# Patient Record
Sex: Male | Born: 2000 | Race: White | Hispanic: No | Marital: Married | Smoking: Never smoker
Health system: Southern US, Community
[De-identification: ages and names within clinical notes are randomized; demographics above are authoritative.]

## PROBLEM LIST (undated history)

## (undated) DIAGNOSIS — S42301A Unspecified fracture of shaft of humerus, right arm, initial encounter for closed fracture: Secondary | ICD-10-CM

## (undated) DIAGNOSIS — Z789 Other specified health status: Secondary | ICD-10-CM

## (undated) DIAGNOSIS — I619 Nontraumatic intracerebral hemorrhage, unspecified: Secondary | ICD-10-CM

---
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
## 2021-06-24 ENCOUNTER — Ambulatory Visit: Payer: Self-pay | Admitting: General Surgery

## 2021-06-24 NOTE — H&P (Signed)
Chief Complaint: Hernia (consultation)       History of Present Illness: Jonathan Finley is a 20 y.o. male who is seen today as an office consultation at the request of PA Anna Genre for evaluation of Hernia (consultation) .     Patient is a 20 year old male, comes in with no past medical history.  Patient's had a hernia for the last 2 to 3 months.  He states that he notices it with lifting.  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  He states that he has more pain at the end of the day.  Patient states that he does notice that the hernia gets larger.  Does have some discomfort with lifting. Patient had no previous abdominal surgery. Has had no signs or symptoms of incarceration or strangulation.     Review of Systems: A complete review of systems was obtained from the patient.  I have reviewed this information and discussed as appropriate with the patient.  See HPI as well for other ROS.   Review of Systems  Constitutional: Negative.   HENT: Negative.   Eyes: Negative.   Respiratory: Negative.   Cardiovascular: Negative.   Gastrointestinal: Negative.   Genitourinary: Negative.   Musculoskeletal: Negative.   Skin: Negative.   Neurological: Negative.   Endo/Heme/Allergies: Negative.   Psychiatric/Behavioral: Negative.         Medical History: Past Medical History  History reviewed. No pertinent past medical history.     There is no problem list on file for this patient.     Past Surgical History       Past Surgical History:  Procedure Laterality Date   shoulder surgery N/A          Allergies  No Known Allergies     No current outpatient medications on file prior to visit.    No current facility-administered medications on file prior to visit.      Family History       Family History  Problem Relation Age of Onset   High blood pressure (Hypertension) Mother          Social History       Tobacco Use  Smoking Status Never Smoker   Smokeless Tobacco Never Used      Social History  Social History        Socioeconomic History   Marital status: Unknown  Tobacco Use   Smoking status: Never Smoker   Smokeless tobacco: Never Used  Building services engineer Use: Never used  Substance and Sexual Activity   Alcohol use: Never   Drug use: Never   Sexual activity: Never        Objective:         Vitals:    05/30/21 0904  BP: 120/80  Pulse: 71  Temp: 36.9 C (98.4 F)  SpO2: 100%  Weight: 89.2 kg (196 lb 9.6 oz)  Height: 185.4 cm (6\' 1" )    Body mass index is 25.94 kg/m.   Physical Exam Constitutional:      Appearance: Normal appearance.  HENT:     Head: Normocephalic and atraumatic.     Nose: Nose normal.     Mouth/Throat:     Mouth: Mucous membranes are moist.     Pharynx: Oropharynx is clear.  Eyes:     General: No scleral icterus.    Extraocular Movements: Extraocular movements intact.     Pupils: Pupils are equal, round, and reactive to light.  Cardiovascular:     Rate and Rhythm: Normal rate and regular rhythm.     Pulses: Normal pulses.     Heart sounds: Normal heart sounds. No murmur heard.   No gallop.  Pulmonary:     Effort: Pulmonary effort is normal. No respiratory distress.     Breath sounds: No wheezing, rhonchi or rales.  Abdominal:     General: Abdomen is flat.     Palpations: Abdomen is soft.     Hernia: A hernia is present. Hernia is present in the right inguinal area.  Musculoskeletal:        General: Normal range of motion.     Cervical back: Normal range of motion.     Right lower leg: No edema.     Left lower leg: No edema.  Skin:    General: Skin is warm and dry.  Neurological:     General: No focal deficit present.     Mental Status: He is alert.  Psychiatric:        Mood and Affect: Mood normal.        Thought Content: Thought content normal.        Judgment: Judgment normal.                Assessment and Plan:  Diagnoses and all orders for this  visit:   Non-recurrent unilateral inguinal hernia without obstruction or gangrene     Jonathan Finley is a 20 y.o. male  Patient is a 20 year old male with a right inguinal hernia.    We will proceed to the OR for a laparoscopic right inguinal hernia repair with mesh. All risks and benefits were discussed with the patient, to generally include infection, bleeding, damage to surrounding structures, acute and chronic nerve pain, and recurrence. Alternatives were offered and described.  All questions were answered and the patient voiced understanding of the procedure and wishes to proceed at this point.

## 2021-06-24 NOTE — H&P (View-Only) (Signed)
Chief Complaint: Hernia (consultation)       History of Present Illness: Jonathan Finley is a 20 y.o. male who is seen today as an office consultation at the request of PA Conroy for evaluation of Hernia (consultation) .     Patient is a 20-year-old male, comes in with no past medical history.  Patient's had a hernia for the last 2 to 3 months.  He states that he notices it with lifting.  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  He states that he has more pain at the end of the day.  Patient states that he does notice that the hernia gets larger.  Does have some discomfort with lifting. Patient had no previous abdominal surgery. Has had no signs or symptoms of incarceration or strangulation.     Review of Systems: A complete review of systems was obtained from the patient.  I have reviewed this information and discussed as appropriate with the patient.  See HPI as well for other ROS.   Review of Systems  Constitutional: Negative.   HENT: Negative.   Eyes: Negative.   Respiratory: Negative.   Cardiovascular: Negative.   Gastrointestinal: Negative.   Genitourinary: Negative.   Musculoskeletal: Negative.   Skin: Negative.   Neurological: Negative.   Endo/Heme/Allergies: Negative.   Psychiatric/Behavioral: Negative.         Medical History: Past Medical History  History reviewed. No pertinent past medical history.     There is no problem list on file for this patient.     Past Surgical History       Past Surgical History:  Procedure Laterality Date   shoulder surgery N/A          Allergies  No Known Allergies     No current outpatient medications on file prior to visit.    No current facility-administered medications on file prior to visit.      Family History       Family History  Problem Relation Age of Onset   High blood pressure (Hypertension) Mother          Social History       Tobacco Use  Smoking Status Never Smoker   Smokeless Tobacco Never Used      Social History  Social History        Socioeconomic History   Marital status: Unknown  Tobacco Use   Smoking status: Never Smoker   Smokeless tobacco: Never Used  Vaping Use   Vaping Use: Never used  Substance and Sexual Activity   Alcohol use: Never   Drug use: Never   Sexual activity: Never        Objective:         Vitals:    05/30/21 0904  BP: 120/80  Pulse: 71  Temp: 36.9 C (98.4 F)  SpO2: 100%  Weight: 89.2 kg (196 lb 9.6 oz)  Height: 185.4 cm (6' 1")    Body mass index is 25.94 kg/m.   Physical Exam Constitutional:      Appearance: Normal appearance.  HENT:     Head: Normocephalic and atraumatic.     Nose: Nose normal.     Mouth/Throat:     Mouth: Mucous membranes are moist.     Pharynx: Oropharynx is clear.  Eyes:     General: No scleral icterus.    Extraocular Movements: Extraocular movements intact.     Pupils: Pupils are equal, round, and reactive to light.    Cardiovascular:     Rate and Rhythm: Normal rate and regular rhythm.     Pulses: Normal pulses.     Heart sounds: Normal heart sounds. No murmur heard.   No gallop.  Pulmonary:     Effort: Pulmonary effort is normal. No respiratory distress.     Breath sounds: No wheezing, rhonchi or rales.  Abdominal:     General: Abdomen is flat.     Palpations: Abdomen is soft.     Hernia: A hernia is present. Hernia is present in the right inguinal area.  Musculoskeletal:        General: Normal range of motion.     Cervical back: Normal range of motion.     Right lower leg: No edema.     Left lower leg: No edema.  Skin:    General: Skin is warm and dry.  Neurological:     General: No focal deficit present.     Mental Status: He is alert.  Psychiatric:        Mood and Affect: Mood normal.        Thought Content: Thought content normal.        Judgment: Judgment normal.                Assessment and Plan:  Diagnoses and all orders for this  visit:   Non-recurrent unilateral inguinal hernia without obstruction or gangrene     Jonathan Finley is a 20 y.o. male  Patient is a 20 year old male with a right inguinal hernia.    We will proceed to the OR for a laparoscopic right inguinal hernia repair with mesh. All risks and benefits were discussed with the patient, to generally include infection, bleeding, damage to surrounding structures, acute and chronic nerve pain, and recurrence. Alternatives were offered and described.  All questions were answered and the patient voiced understanding of the procedure and wishes to proceed at this point.

## 2021-06-25 ENCOUNTER — Encounter (HOSPITAL_COMMUNITY): Payer: Self-pay | Admitting: General Surgery

## 2021-06-25 ENCOUNTER — Other Ambulatory Visit: Payer: Self-pay

## 2021-06-25 NOTE — Progress Notes (Signed)
Spoke with pt for pre-op call. Pt denies cardiac history or Diabetes.  Pt's surgery is scheduled as ambulatory so no Covid test is required prior to surgery. Pt denies any Covid symptoms.

## 2021-06-26 ENCOUNTER — Ambulatory Visit (HOSPITAL_COMMUNITY): Payer: Medicaid Other

## 2021-06-26 ENCOUNTER — Encounter (HOSPITAL_COMMUNITY)
Admission: RE | Disposition: A | Discharge status: Home / Self Care | Payer: Self-pay | Source: Home / Self Care | Attending: General Surgery

## 2021-06-26 ENCOUNTER — Ambulatory Visit (HOSPITAL_COMMUNITY)
Admission: RE | Admit: 2021-06-26 | Discharge: 2021-06-26 | Disposition: A | Discharge status: Home / Self Care | Payer: Medicaid Other | Attending: General Surgery | Admitting: General Surgery

## 2021-06-26 DIAGNOSIS — K4090 Unilateral inguinal hernia, without obstruction or gangrene, not specified as recurrent: Secondary | ICD-10-CM | POA: Insufficient documentation

## 2021-06-26 HISTORY — PX: INSERTION OF MESH: SHX5868

## 2021-06-26 HISTORY — DX: Other specified health status: Z78.9

## 2021-06-26 HISTORY — PX: INGUINAL HERNIA REPAIR: SHX194

## 2021-06-26 LAB — CBC
HCT: 44.8 % (ref 39.0–52.0)
Hemoglobin: 16 g/dL (ref 13.0–17.0)
MCH: 31.6 pg (ref 26.0–34.0)
MCHC: 35.7 g/dL (ref 30.0–36.0)
MCV: 88.4 fL (ref 80.0–100.0)
Platelets: 191 10*3/uL (ref 150–400)
RBC: 5.07 MIL/uL (ref 4.22–5.81)
RDW: 11.8 % (ref 11.5–15.5)
WBC: 3.6 10*3/uL — ABNORMAL LOW (ref 4.0–10.5)
nRBC: 0 % (ref 0.0–0.2)

## 2021-06-26 SURGERY — REPAIR, HERNIA, INGUINAL, LAPAROSCOPIC
Anesthesia: General | Site: Inguinal | Laterality: Right

## 2021-06-26 MED ORDER — DEXAMETHASONE SODIUM PHOSPHATE 10 MG/ML IJ SOLN
INTRAMUSCULAR | Status: DC | PRN
  Administered 2021-06-26: 10 mg via INTRAVENOUS

## 2021-06-26 MED ORDER — FENTANYL CITRATE (PF) 100 MCG/2ML IJ SOLN
25.0000 ug | INTRAMUSCULAR | Status: DC | PRN
  Administered 2021-06-26 (×3): 25 ug via INTRAVENOUS

## 2021-06-26 MED ORDER — CHLORHEXIDINE GLUCONATE 0.12 % MT SOLN: 15.0000 mL | Freq: Once | OROMUCOSAL | Status: AC

## 2021-06-26 MED ORDER — LACTATED RINGERS IV SOLN: INTRAVENOUS | Status: DC

## 2021-06-26 MED ORDER — PROTAMINE SULFATE 10 MG/ML IV SOLN
INTRAVENOUS | Status: AC
  Filled 2021-06-26: qty 5

## 2021-06-26 MED ORDER — ACETAMINOPHEN 500 MG PO TABS: 1000.0000 mg | ORAL_TABLET | ORAL | Status: AC

## 2021-06-26 MED ORDER — MIDAZOLAM HCL 2 MG/2ML IJ SOLN
INTRAMUSCULAR | Status: AC
  Filled 2021-06-26: qty 2

## 2021-06-26 MED ORDER — OXYCODONE HCL 5 MG/5ML PO SOLN: 5.0000 mg | Freq: Once | ORAL | Status: AC | PRN

## 2021-06-26 MED ORDER — TRAMADOL HCL 50 MG PO TABS: 50.0000 mg | ORAL_TABLET | Freq: Four times a day (QID) | ORAL | 0 refills | Status: DC | PRN

## 2021-06-26 MED ORDER — OXYCODONE HCL 5 MG PO TABS
5.0000 mg | ORAL_TABLET | Freq: Once | ORAL | Status: AC | PRN
  Administered 2021-06-26: 5 mg via ORAL

## 2021-06-26 MED ORDER — OXYCODONE HCL 5 MG PO TABS
ORAL_TABLET | ORAL | Status: AC
  Filled 2021-06-26: qty 1

## 2021-06-26 MED ORDER — CHLORHEXIDINE GLUCONATE CLOTH 2 % EX PADS: 6.0000 | MEDICATED_PAD | Freq: Once | CUTANEOUS | Status: DC

## 2021-06-26 MED ORDER — LIDOCAINE 2% (20 MG/ML) 5 ML SYRINGE
INTRAMUSCULAR | Status: DC | PRN
  Administered 2021-06-26: 60 mg via INTRAVENOUS

## 2021-06-26 MED ORDER — ACETAMINOPHEN 500 MG PO TABS
ORAL_TABLET | ORAL | Status: AC
  Administered 2021-06-26: 1000 mg via ORAL
  Filled 2021-06-26: qty 2

## 2021-06-26 MED ORDER — MIDAZOLAM HCL 2 MG/2ML IJ SOLN
INTRAMUSCULAR | Status: DC | PRN
  Administered 2021-06-26: 2 mg via INTRAVENOUS

## 2021-06-26 MED ORDER — 0.9 % SODIUM CHLORIDE (POUR BTL) OPTIME
TOPICAL | Status: DC | PRN
  Administered 2021-06-26: 1000 mL

## 2021-06-26 MED ORDER — VANCOMYCIN HCL IN DEXTROSE 1-5 GM/200ML-% IV SOLN
INTRAVENOUS | Status: AC
  Administered 2021-06-26: 1000 mg via INTRAVENOUS
  Filled 2021-06-26: qty 200

## 2021-06-26 MED ORDER — PROPOFOL 10 MG/ML IV BOLUS
INTRAVENOUS | Status: DC | PRN
  Administered 2021-06-26: 200 mg via INTRAVENOUS

## 2021-06-26 MED ORDER — ROCURONIUM BROMIDE 10 MG/ML (PF) SYRINGE
PREFILLED_SYRINGE | INTRAVENOUS | Status: DC | PRN
  Administered 2021-06-26: 60 mg via INTRAVENOUS

## 2021-06-26 MED ORDER — FENTANYL CITRATE (PF) 250 MCG/5ML IJ SOLN
INTRAMUSCULAR | Status: AC
  Filled 2021-06-26: qty 5

## 2021-06-26 MED ORDER — SUGAMMADEX SODIUM 200 MG/2ML IV SOLN
INTRAVENOUS | Status: DC | PRN
  Administered 2021-06-26: 200 mg via INTRAVENOUS

## 2021-06-26 MED ORDER — PROPOFOL 10 MG/ML IV BOLUS
INTRAVENOUS | Status: AC
  Filled 2021-06-26: qty 20

## 2021-06-26 MED ORDER — ORAL CARE MOUTH RINSE: 15.0000 mL | Freq: Once | OROMUCOSAL | Status: AC

## 2021-06-26 MED ORDER — CEFAZOLIN SODIUM-DEXTROSE 2-4 GM/100ML-% IV SOLN: 2.0000 g | INTRAVENOUS | Status: DC

## 2021-06-26 MED ORDER — ENSURE PRE-SURGERY PO LIQD: 296.0000 mL | Freq: Once | ORAL | Status: DC

## 2021-06-26 MED ORDER — DEXAMETHASONE SODIUM PHOSPHATE 10 MG/ML IJ SOLN
INTRAMUSCULAR | Status: AC
  Filled 2021-06-26: qty 1

## 2021-06-26 MED ORDER — VANCOMYCIN HCL IN DEXTROSE 1-5 GM/200ML-% IV SOLN: 1000.0000 mg | INTRAVENOUS | Status: AC

## 2021-06-26 MED ORDER — BUPIVACAINE HCL 0.25 % IJ SOLN
INTRAMUSCULAR | Status: DC | PRN
  Administered 2021-06-26: 5 mL

## 2021-06-26 MED ORDER — ONDANSETRON HCL 4 MG/2ML IJ SOLN: 4.0000 mg | Freq: Four times a day (QID) | INTRAMUSCULAR | Status: DC | PRN

## 2021-06-26 MED ORDER — ONDANSETRON HCL 4 MG/2ML IJ SOLN
INTRAMUSCULAR | Status: AC
  Filled 2021-06-26: qty 2

## 2021-06-26 MED ORDER — FENTANYL CITRATE (PF) 250 MCG/5ML IJ SOLN
INTRAMUSCULAR | Status: DC | PRN
  Administered 2021-06-26: 100 ug via INTRAVENOUS
  Administered 2021-06-26 (×2): 50 ug via INTRAVENOUS

## 2021-06-26 MED ORDER — CHLORHEXIDINE GLUCONATE 0.12 % MT SOLN
OROMUCOSAL | Status: AC
  Administered 2021-06-26: 15 mL via OROMUCOSAL
  Filled 2021-06-26: qty 15

## 2021-06-26 MED ORDER — BUPIVACAINE HCL (PF) 0.25 % IJ SOLN
INTRAMUSCULAR | Status: AC
  Filled 2021-06-26: qty 30

## 2021-06-26 MED ORDER — LIDOCAINE 2% (20 MG/ML) 5 ML SYRINGE
INTRAMUSCULAR | Status: AC
  Filled 2021-06-26: qty 5

## 2021-06-26 MED ORDER — ONDANSETRON HCL 4 MG/2ML IJ SOLN
INTRAMUSCULAR | Status: DC | PRN
  Administered 2021-06-26: 4 mg via INTRAVENOUS

## 2021-06-26 MED ORDER — ROCURONIUM BROMIDE 10 MG/ML (PF) SYRINGE
PREFILLED_SYRINGE | INTRAVENOUS | Status: AC
  Filled 2021-06-26: qty 10

## 2021-06-26 MED ORDER — DEXMEDETOMIDINE (PRECEDEX) IN NS 20 MCG/5ML (4 MCG/ML) IV SYRINGE
PREFILLED_SYRINGE | INTRAVENOUS | Status: DC | PRN
  Administered 2021-06-26 (×2): 4 ug via INTRAVENOUS
  Administered 2021-06-26: 8 ug via INTRAVENOUS
  Administered 2021-06-26: 4 ug via INTRAVENOUS

## 2021-06-26 MED ORDER — DIPHENHYDRAMINE HCL 50 MG/ML IJ SOLN
INTRAMUSCULAR | Status: DC | PRN
  Administered 2021-06-26: 25 mg via INTRAVENOUS

## 2021-06-26 MED ORDER — FENTANYL CITRATE (PF) 100 MCG/2ML IJ SOLN
INTRAMUSCULAR | Status: AC
  Filled 2021-06-26: qty 2

## 2021-06-26 SURGICAL SUPPLY — 38 items
ADH SKN CLS APL DERMABOND .7 (GAUZE/BANDAGES/DRESSINGS) ×1
COVER SURGICAL LIGHT HANDLE (MISCELLANEOUS) ×2 IMPLANT
DERMABOND ADVANCED (GAUZE/BANDAGES/DRESSINGS) ×1
DERMABOND ADVANCED .7 DNX12 (GAUZE/BANDAGES/DRESSINGS) ×1 IMPLANT
DISSECTOR BLUNT TIP ENDO 5MM (MISCELLANEOUS) IMPLANT
ELECT REM PT RETURN 9FT ADLT (ELECTROSURGICAL) ×2
ELECTRODE REM PT RTRN 9FT ADLT (ELECTROSURGICAL) ×1 IMPLANT
ENDOLOOP SUT PDS II  0 18 (SUTURE)
ENDOLOOP SUT PDS II 0 18 (SUTURE) IMPLANT
GLOVE SURG ENC MOIS LTX SZ7.5 (GLOVE) ×2 IMPLANT
GOWN STRL REUS W/ TWL LRG LVL3 (GOWN DISPOSABLE) ×2 IMPLANT
GOWN STRL REUS W/ TWL XL LVL3 (GOWN DISPOSABLE) ×1 IMPLANT
GOWN STRL REUS W/TWL LRG LVL3 (GOWN DISPOSABLE) ×4
GOWN STRL REUS W/TWL XL LVL3 (GOWN DISPOSABLE) ×2
KIT BASIN OR (CUSTOM PROCEDURE TRAY) ×2 IMPLANT
KIT TURNOVER KIT B (KITS) ×2 IMPLANT
MESH 3DMAX 5X7 RT XLRG (Mesh General) ×1 IMPLANT
NDL INSUFFLATION 14GA 120MM (NEEDLE) IMPLANT
NEEDLE INSUFFLATION 14GA 120MM (NEEDLE) ×2 IMPLANT
NS IRRIG 1000ML POUR BTL (IV SOLUTION) ×2 IMPLANT
PAD ARMBOARD 7.5X6 YLW CONV (MISCELLANEOUS) ×4 IMPLANT
RELOAD STAPLE 4.0 BLU F/HERNIA (INSTRUMENTS) IMPLANT
RELOAD STAPLE 4.8 BLK F/HERNIA (STAPLE) IMPLANT
RELOAD STAPLE HERNIA 4.0 BLUE (INSTRUMENTS) ×2 IMPLANT
RELOAD STAPLE HERNIA 4.8 BLK (STAPLE) IMPLANT
SCISSORS LAP 5X35 DISP (ENDOMECHANICALS) ×1 IMPLANT
SET TUBE SMOKE EVAC HIGH FLOW (TUBING) ×2 IMPLANT
STAPLER HERNIA 12 8.5 360D (INSTRUMENTS) ×1 IMPLANT
SUT MNCRL AB 4-0 PS2 18 (SUTURE) ×2 IMPLANT
SYRINGE TOOMEY DISP (SYRINGE) ×2 IMPLANT
TOWEL GREEN STERILE (TOWEL DISPOSABLE) ×2 IMPLANT
TOWEL GREEN STERILE FF (TOWEL DISPOSABLE) ×2 IMPLANT
TRAY LAPAROSCOPIC MC (CUSTOM PROCEDURE TRAY) ×2 IMPLANT
TROCAR OPTICAL SHORT 5MM (TROCAR) ×2 IMPLANT
TROCAR OPTICAL SLV SHORT 5MM (TROCAR) ×2 IMPLANT
TROCAR XCEL 12X100 BLDLESS (ENDOMECHANICALS) ×2 IMPLANT
WARMER LAPAROSCOPE (MISCELLANEOUS) ×2 IMPLANT
WATER STERILE IRR 1000ML POUR (IV SOLUTION) ×2 IMPLANT

## 2021-06-26 NOTE — Transfer of Care (Signed)
Immediate Anesthesia Transfer of Care Note  Patient: Jonathan Finley  Procedure(s) Performed: LAPAROSCOPIC RIGHT INGUINAL HERNIA REPAIR (Right: Inguinal) INSERTION OF MESH (Right: Inguinal)  Patient Location: PACU  Anesthesia Type:General  Level of Consciousness: drowsy  Airway & Oxygen Therapy: Patient Spontanous Breathing and Patient connected to face mask oxygen  Post-op Assessment: Report given to RN and Post -op Vital signs reviewed and stable  Post vital signs: Reviewed and stable  Last Vitals:  Vitals Value Taken Time  BP 112/54 06/26/21 1328  Temp    Pulse 51 06/26/21 1331  Resp 11 06/26/21 1331  SpO2 100 % 06/26/21 1331  Vitals shown include unvalidated device data.  Last Pain:  Vitals:   06/26/21 1102  TempSrc:   PainSc: 0-No pain         Complications: No notable events documented.

## 2021-06-26 NOTE — Anesthesia Procedure Notes (Signed)
Procedure Name: Intubation Date/Time: 06/26/2021 12:26 PM Performed by: Leonor Liv, CRNA Pre-anesthesia Checklist: Patient identified, Emergency Drugs available, Suction available and Patient being monitored Patient Re-evaluated:Patient Re-evaluated prior to induction Oxygen Delivery Method: Circle System Utilized Preoxygenation: Pre-oxygenation with 100% oxygen Induction Type: IV induction Ventilation: Mask ventilation without difficulty Laryngoscope Size: Mac and 4 Grade View: Grade I Tube type: Oral Tube size: 7.5 mm Number of attempts: 1 Airway Equipment and Method: Stylet and Oral airway Placement Confirmation: ETT inserted through vocal cords under direct vision, positive ETCO2 and breath sounds checked- equal and bilateral Secured at: 23 cm Tube secured with: Tape Dental Injury: Teeth and Oropharynx as per pre-operative assessment  Comments: Inserted by Paulina Fusi, SRNA

## 2021-06-26 NOTE — Interval H&P Note (Signed)
History and Physical Interval Note:  06/26/2021 11:28 AM  Jonathan Finley  has presented today for surgery, with the diagnosis of RIGHT INGUINAL HERNIA.  The various methods of treatment have been discussed with the patient and family. After consideration of risks, benefits and other options for treatment, the patient has consented to  Procedure(s): LAPAROSCOPIC RIGHT INGUINAL HERNIA REPAIR WITH MESH (Right) as a surgical intervention.  The patient's history has been reviewed, patient examined, no change in status, stable for surgery.  I have reviewed the patient's chart and labs.  Questions were answered to the patient's satisfaction.     Axel Filler

## 2021-06-26 NOTE — Op Note (Signed)
06/26/2021  1:11 PM  PATIENT:  Jonathan Finley  20 y.o. male  PRE-OPERATIVE DIAGNOSIS:  RIGHT INGUINAL HERNIA  POST-OPERATIVE DIAGNOSIS:  RIGHT INDIRECT INGUINAL HERNIA  PROCEDURE:  Procedure(s): LAPAROSCOPIC RIGHT INGUINAL HERNIA REPAIR (Right) INSERTION OF MESH (Right)  SURGEON:  Surgeon(s) and Role:    Axel Filler, MD - Primary  ASSISTANTS:  Patrici Ranks, RNFA  ANESTHESIA:   local and general  EBL:  minimal   BLOOD ADMINISTERED:none  DRAINS: none   LOCAL MEDICATIONS USED:  BUPIVICAINE   SPECIMEN:  No Specimen  DISPOSITION OF SPECIMEN:  N/A  COUNTS:  YES  TOURNIQUET:  * No tourniquets in log *  DICTATION: .Dragon Dictation Counts: reported as correct x 2  Findings:  The patient had a large right indirect hernia and a moderated sized cord lipoma  Indications for procedure:  The patient is a 20 year old male with a right inguinal hernia for several months. Patient complained of symptomatology to his right inguinal area. The patient was taken back for elective inguinal hernia repair.  Details of the procedure: The patient was taken back to the operating room. The patient was placed in supine position with bilateral SCDs in place.  The patient was prepped and draped in the usual sterile fashion.  After appropriate anitbiotics were confirmed, a time-out was confirmed and all facts were verified.  0.25% Marcaine was used to infiltrate the umbilical area. A 11-blade was used to cut down the skin and blunt dissection was used to get the anterior fashion.  The anterior fascia was incised approximately 1 cm and the muscles were retracted laterally. Blunt dissection was then used to create a space in the preperitoneal area. At this time a 10 mm camera was then introduced into the space and advanced the pubic tubercle and a 12 mm trocar was placed over this and insufflation was started.  At this time and space was created from medial to laterally the preperitoneal space.   Cooper's ligament was initially cleaned off.  The hernia sac was identified in the indirect space. Dissection of the hernia sac and cord structures was undertaken the vas deferens was identified and protected in all parts of the case.    Once the hernia sac was taken down to approximately the umbilicus a Bard 3D Max mesh, size: Barney Drain, was  introduced into the preperitoneal space.  The mesh was brought over to cover the direct and indirect hernia spaces.  This was anchored into place and secured to Cooper's ligament with 4.30mm staples from a Coviden hernia stapler. It was anchored to the anterior abdominal wall with 4.8 mm staples. The hernia sac was seen lying posterior to the mesh. There was no staples placed laterally. The insufflation was evacuated and the peritoneum was seen posterior to the mesh. The trochars were removed. The anterior fascia was reapproximated using #1 Vicryl on a UR- 6.  Intra-abdominal air was evacuated and the Veress needle removed. The skin was reapproximated using 4-0 Monocryl subcuticular fashion and Dermabond. The patient was awakened from general anesthesia and taken to recovery in stable condition.   PLAN OF CARE: Discharge to home after PACU  PATIENT DISPOSITION:  PACU - hemodynamically stable.   Delay start of Pharmacological VTE agent (>24hrs) due to surgical blood loss or risk of bleeding: not applicable

## 2021-06-26 NOTE — Discharge Instructions (Signed)
[facility] _______[facility] ? ?INGUINAL HERNIA REPAIR: POST OP INSTRUCTIONS ? ?Always review your discharge instruction sheet given to you by the facility where your surgery was performed. ?IF YOU HAVE DISABILITY OR FAMILY LEAVE FORMS, YOU MUST BRING THEM TO THE OFFICE FOR PROCESSING.   ?DO NOT GIVE THEM TO YOUR DOCTOR. ? ?1. A  prescription for pain medication may be given to you upon discharge.  Take your pain medication as prescribed, if needed.  If narcotic pain medicine is not needed, then you may take acetaminophen (Tylenol) or ibuprofen (Advil) as needed. ?2. Take your usually prescribed medications unless otherwise directed. ?If you need a refill on your pain medication, please contact your pharmacy.  They will contact our office to request authorization. Prescriptions will not be filled after 5 pm or on week-ends. ?3. You should follow a light diet the first 24 hours after arrival home, such as soup and crackers, etc.  Be sure to include lots of fluids daily.  Resume your normal diet the day after surgery. ?4.Most patients will experience some swelling and bruising around the umbilicus or in the groin and scrotum.  Ice packs and reclining will help.  Swelling and bruising can take several days to resolve.  ?6. It is common to experience some constipation if taking pain medication after surgery.  Increasing fluid intake and taking a stool softener (such as Colace) will usually help or prevent this problem from occurring.  A mild laxative (Milk of Magnesia or Miralax) should be taken according to package directions if there are no bowel movements after 48 hours. ?7. Unless discharge instructions indicate otherwise, you may remove your bandages 24-48 hours after surgery, and you may shower at that time.  You may have steri-strips (small skin tapes) in place directly over the incision.  These strips should be left on the skin for 7-10 days.  If your surgeon used skin glue on the incision, you may  shower in 24 hours.  The glue will flake off over the next 2-3 weeks.  Any sutures or staples will be removed at the office during your follow-up visit. ?8. ACTIVITIES:  You may resume regular (light) daily activities beginning the next day--such as daily self-care, walking, climbing stairs--gradually increasing activities as tolerated.  You may have sexual intercourse when it is comfortable.  Refrain from any heavy lifting or straining until approved by your doctor. ? ?a.You may drive when you are no longer taking prescription pain medication, you can comfortably wear a seatbelt, and you can safely maneuver your car and apply brakes. ?b.RETURN TO WORK:   ?_____________________________________________ ? ?9.You should see your doctor in the office for a follow-up appointment approximately 2-3 weeks after your surgery.  Make sure that you call for this appointment within a day or two after you arrive home to insure a convenient appointment time. ?10.OTHER INSTRUCTIONS: _________________________ ?   _____________________________________ ? ?WHEN TO CALL YOUR DOCTOR: ?Fever over 101.0 ?Inability to urinate ?Nausea and/or vomiting ?Extreme swelling or bruising ?Continued bleeding from incision. ?Increased pain, redness, or drainage from the incision ? ?The clinic staff is available to answer your questions during regular business hours.  Please don?t hesitate to call and ask to speak to one of the nurses for clinical concerns.  If you have a medical emergency, go to the nearest emergency room or call 911.  A surgeon from [facility] is always on call at the hospital ? ? ?[address], [address], [place], [place]    [address] ? ? [address] [address], [place], [place]   [address] ?[phone] ? [phone] ? FAX [phone] ?Web site: [email or web address] ? ?

## 2021-06-26 NOTE — Anesthesia Preprocedure Evaluation (Signed)
Anesthesia Evaluation  Patient identified by MRN, date of birth, ID band Patient awake    Reviewed: Allergy & Precautions, H&P , NPO status , Patient's Chart, lab work & pertinent test results  Airway Mallampati: II   Neck ROM: full    Dental   Pulmonary neg pulmonary ROS,    breath sounds clear to auscultation       Cardiovascular negative cardio ROS   Rhythm:regular Rate:Normal     Neuro/Psych    GI/Hepatic   Endo/Other    Renal/GU      Musculoskeletal   Abdominal   Peds  Hematology   Anesthesia Other Findings   Reproductive/Obstetrics                             Anesthesia Physical Anesthesia Plan  ASA: 1  Anesthesia Plan: General   Post-op Pain Management:    Induction: Intravenous  PONV Risk Score and Plan: 2 and Ondansetron, Dexamethasone, Midazolam and Treatment may vary due to age or medical condition  Airway Management Planned: Oral ETT  Additional Equipment:   Intra-op Plan:   Post-operative Plan: Extubation in OR  Informed Consent: I have reviewed the patients History and Physical, chart, labs and discussed the procedure including the risks, benefits and alternatives for the proposed anesthesia with the patient or authorized representative who has indicated his/her understanding and acceptance.     Dental advisory given  Plan Discussed with: CRNA, Anesthesiologist and Surgeon  Anesthesia Plan Comments:         Anesthesia Quick Evaluation  

## 2021-06-26 NOTE — Progress Notes (Signed)
Patient developed redness on head and upper torso.  He was itching on his head. Vanc stopped and Dr. Chaney Malling notified.

## 2021-06-27 ENCOUNTER — Encounter (HOSPITAL_COMMUNITY): Payer: Self-pay | Admitting: General Surgery

## 2021-06-27 NOTE — Anesthesia Postprocedure Evaluation (Signed)
Anesthesia Post Note  Patient: Jonathan Finley  Procedure(s) Performed: LAPAROSCOPIC RIGHT INGUINAL HERNIA REPAIR (Right: Inguinal) INSERTION OF MESH (Right: Inguinal)     Patient location during evaluation: PACU Anesthesia Type: General Level of consciousness: awake and alert Pain management: pain level controlled Vital Signs Assessment: post-procedure vital signs reviewed and stable Respiratory status: spontaneous breathing, nonlabored ventilation, respiratory function stable and patient connected to nasal cannula oxygen Cardiovascular status: blood pressure returned to baseline and stable Postop Assessment: no apparent nausea or vomiting Anesthetic complications: no   No notable events documented.  Last Vitals:  Vitals:   06/26/21 1445 06/26/21 1500  BP: (!) 121/59 118/65  Pulse: 68 (!) 54  Resp: 14 15  Temp:    SpO2: 100% 100%    Last Pain:  Vitals:   06/26/21 1445  TempSrc:   PainSc: 6                  Ginette Bradway S

## 2022-05-08 ENCOUNTER — Emergency Department (HOSPITAL_COMMUNITY)
Admission: EM | Admit: 2022-05-08 | Discharge: 2022-05-08 | Disposition: A | Discharge status: Home / Self Care | Payer: Medicaid Other | Attending: Emergency Medicine | Admitting: Emergency Medicine

## 2022-05-08 ENCOUNTER — Emergency Department (HOSPITAL_COMMUNITY): Payer: Medicaid Other

## 2022-05-08 ENCOUNTER — Encounter (HOSPITAL_COMMUNITY): Payer: Self-pay

## 2022-05-08 ENCOUNTER — Other Ambulatory Visit: Payer: Self-pay

## 2022-05-08 DIAGNOSIS — S61011A Laceration without foreign body of right thumb without damage to nail, initial encounter: Secondary | ICD-10-CM | POA: Insufficient documentation

## 2022-05-08 DIAGNOSIS — S60931A Unspecified superficial injury of right thumb, initial encounter: Secondary | ICD-10-CM | POA: Diagnosis present

## 2022-05-08 DIAGNOSIS — S60111A Contusion of right thumb with damage to nail, initial encounter: Secondary | ICD-10-CM | POA: Diagnosis not present

## 2022-05-08 DIAGNOSIS — W231XXA Caught, crushed, jammed, or pinched between stationary objects, initial encounter: Secondary | ICD-10-CM | POA: Insufficient documentation

## 2022-05-08 DIAGNOSIS — Z23 Encounter for immunization: Secondary | ICD-10-CM | POA: Insufficient documentation

## 2022-05-08 MED ORDER — HYDROCODONE-ACETAMINOPHEN 5-325 MG PO TABS
1.0000 | ORAL_TABLET | Freq: Once | ORAL | Status: AC
  Administered 2022-05-08: 1 via ORAL
  Filled 2022-05-08: qty 1

## 2022-05-08 MED ORDER — LIDOCAINE HCL (PF) 1 % IJ SOLN
30.0000 mL | Freq: Once | INTRAMUSCULAR | Status: AC
  Administered 2022-05-08: 30 mL via INTRADERMAL
  Filled 2022-05-08: qty 30

## 2022-05-08 MED ORDER — TETANUS-DIPHTH-ACELL PERTUSSIS 5-2.5-18.5 LF-MCG/0.5 IM SUSY
0.5000 mL | PREFILLED_SYRINGE | Freq: Once | INTRAMUSCULAR | Status: AC
  Administered 2022-05-08: 0.5 mL via INTRAMUSCULAR
  Filled 2022-05-08: qty 0.5

## 2022-05-08 MED ORDER — IBUPROFEN 400 MG PO TABS
600.0000 mg | ORAL_TABLET | Freq: Once | ORAL | Status: AC
  Administered 2022-05-08: 600 mg via ORAL
  Filled 2022-05-08: qty 1

## 2022-05-08 MED ORDER — OXYCODONE-ACETAMINOPHEN 5-325 MG PO TABS: 1.0000 | ORAL_TABLET | Freq: Four times a day (QID) | ORAL | 0 refills | Status: AC | PRN

## 2022-05-08 MED ORDER — CEPHALEXIN 500 MG PO CAPS: 500.0000 mg | ORAL_CAPSULE | Freq: Two times a day (BID) | ORAL | 0 refills | Status: AC

## 2022-05-08 MED ORDER — FENTANYL CITRATE (PF) 100 MCG/2ML IJ SOLN
INTRAMUSCULAR | Status: AC
  Filled 2022-05-08: qty 2

## 2022-05-08 NOTE — Discharge Instructions (Addendum)
Follow-up with a hand specialist listed below. Take the pain medication as needed along with ibuprofen. Take the Keflex as prescribed. Return to the ER if you start to experience signs of infection including redness, swelling, pus draining from the area, fever or increased pain.

## 2022-05-08 NOTE — ED Triage Notes (Signed)
Pt arrived POV from work c/o a right thumb injury. Pt states he was working with a machine when a metal cable got caught and his finger got stuck. Pt has a laceration to the thumb, the tip is black and blue.
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
## 2022-05-08 NOTE — ED Provider Notes (Signed)
..  Incision and Drainage  Date/Time: 05/08/2022 4:07 PM  Performed by: Dietrich Pates, PA-C Authorized by: Dietrich Pates, PA-C   Location:    Type:  Subungual hematoma   Location:  Upper extremity   Upper extremity location:  Finger   Finger location:  R thumb Pre-procedure details:    Skin preparation:  Antiseptic wash Anesthesia:    Anesthesia method: Done by Dr. Jodi Mourning. Procedure details:    Needle aspiration: no     Incision depth:  Subungual   Drainage:  Bloody Post-procedure details:    Procedure completion:  Tolerated well, no immediate complications     Dietrich Pates, PA-C 05/08/22 1609    Blane Ohara, MD 05/13/22 2350

## 2022-05-08 NOTE — ED Provider Notes (Signed)
[facility] EMERGENCY DEPARTMENT Provider Note   CSN: [id number] Arrival date & time: 05/08/22  [id number]     History  Chief Complaint  Patient presents with   Finger Injury    Jonathan Finley is a 21 y.o. male.  Patient with no active medical problems presents with right thumb injury.  Patient was working with a machine and a metal lying/cable got caught and his finger got stuck with it wrapped around.  Machine turned the cable at 6000 RPMs.  Last tetanus approximately 9 years ago.  Patient had mild injury to the back aspect of his other fingers on the right hand but no other open wounds.  Mild bleeding has been controlled with time.  Pain severe.  Blackened discoloration of the tip of the thumb.       Home Medications Prior to Admission medications   Not on File      Allergies    Vancomycin and Penicillins    Review of Systems   Review of Systems  Constitutional:  Negative for chills and fever.  HENT:  Negative for congestion.   Eyes:  Negative for visual disturbance.  Respiratory:  Negative for shortness of breath.   Cardiovascular:  Negative for chest pain.  Gastrointestinal:  Negative for abdominal pain and vomiting.  Genitourinary:  Negative for dysuria and flank pain.  Musculoskeletal:  Negative for back pain, neck pain and neck stiffness.  Skin:  Positive for wound. Negative for rash.  Neurological:  Negative for light-headedness and headaches.    Physical Exam Updated Vital Signs BP 126/88 (BP Location: Right Arm)   Pulse 60   Temp 97.7 F (36.5 C) (Oral)   Resp 18   Ht 6\' 2"  (1.88 m)   Wt 95.3 kg   SpO2 100%   BMI 26.96 kg/m  Physical Exam Vitals and nursing note reviewed.  Constitutional:      General: He is not in acute distress.    Appearance: He is well-developed.  HENT:     Head: Normocephalic and atraumatic.     Mouth/Throat:     Mouth: Mucous membranes are moist.  Eyes:     General:        Right eye: No discharge.         Left eye: No discharge.     Conjunctiva/sclera: Conjunctivae normal.  Neck:     Trachea: No tracheal deviation.  Cardiovascular:     Rate and Rhythm: Normal rate.  Pulmonary:     Effort: Pulmonary effort is normal.  Abdominal:     General: There is no distension.  Musculoskeletal:     Cervical back: Normal range of motion.     Comments: Patient has significant tenderness with flexion or palpation of the right thumb.  Ecchymosis darkening palmar aspect and at the base of the nail subungual hematoma.  Patient can flex and extend remaining fingers with mild discomfort.  Patient has dried blood lateral aspect of right thumb.  See images for further details.  Skin:    General: Skin is warm.     Capillary Refill: Capillary refill takes less than 2 seconds.  Neurological:     General: No focal deficit present.     Mental Status: He is alert.  Psychiatric:        Mood and Affect: Mood normal.          ED Results / Procedures / Treatments   Labs (all labs ordered are listed, but only abnormal results are displayed)  Labs Reviewed - No data to display  EKG None  Radiology DG Finger Thumb Right  Result Date: 05/08/2022 CLINICAL DATA:  Machinist injury, laceration EXAM: RIGHT THUMB 2+V COMPARISON:  None Available. FINDINGS: There is no evidence of fracture or dislocation. There is no evidence of arthropathy or other focal bone abnormality. Soft tissue edema about the digit. IMPRESSION: No fracture or dislocation of the right thumb. Soft tissue edema about the digit. No radiopaque foreign body. Electronically Signed   By: Delanna Ahmadi M.D.   On: 05/08/2022 10:40    Procedures .Nerve Block  Date/Time: 05/08/2022 3:49 PM  Performed by: Elnora Morrison, MD Authorized by: Elnora Morrison, MD   Consent:    Consent obtained:  Verbal   Consent given by:  Patient   Risks, benefits, and alternatives were discussed: yes     Risks discussed:  Nerve damage, infection, pain, swelling,  unsuccessful block and bleeding   Alternatives discussed:  No treatment Universal protocol:    Procedure explained and questions answered to patient or proxy's satisfaction: yes     Patient identity confirmed:  Arm band Indications:    Indications:  Pain relief and procedural anesthesia Location:    Body area:  Upper extremity   Laterality:  Right Pre-procedure details:    Skin preparation:  Alcohol   Preparation: Patient was prepped and draped in usual sterile fashion   Procedure details:    Block needle gauge:  25 G   Injection procedure:  Anatomic landmarks identified   Paresthesia:  Immediately resolved Post-procedure details:    Outcome:  Pain improved   Procedure completion:  Tolerated Comments:     Nail trefinated after nerve block and wound cleaned     Medications Ordered in ED Medications  lidocaine (PF) (XYLOCAINE) 1 % injection 30 mL (has no administration in time range)  ibuprofen (ADVIL) tablet 600 mg (has no administration in time range)  HYDROcodone-acetaminophen (NORCO/VICODIN) 5-325 MG per tablet 1 tablet (has no administration in time range)    ED Course/ Medical Decision Making/ A&P                           Medical Decision Making Risk Prescription drug management.   Patient presents with high risk injury to the right thumb with significant damage ecchymosis and subungual hematoma.  X-ray ordered and reviewed independently no acute fracture but soft tissue swelling.  Oral pain meds and plan for finger block for further cleaning of the wound and assessment.  With mechanism and high RPMs Ortho/hand consulted.  Tetanus updated. Ortho assessed recommended finger block/wound care/cleaning.  Discussed with patient and finger block performed pain improved, trephinated the nail small amount of blood released.  Physician assistant assisting.  Plan for oral antibiotics, pain meds and close follow-up outpatient.  Work note will be needed.         Final  Clinical Impression(s) / ED Diagnoses Final diagnoses:  Thumb laceration, right, initial encounter  Contusion of right thumb nail, initial encounter    Rx / DC Orders ED Discharge Orders     None         Elnora Morrison, MD 05/08/22 1550

## 2022-05-08 NOTE — ED Provider Triage Note (Signed)
Emergency Medicine Provider Triage Evaluation Note  Jonathan Finley , a 21 y.o. male  was evaluated in triage.  Pt complains of right thumb injury at work.  Was working with machine when metal cable got caught and his finger got stuck. Unknown last tetanus.  Review of Systems  Positive: Finger wound Negative: weakness  Physical Exam  BP 139/81 (BP Location: Left Arm)   Pulse 67   Temp 98.3 F (36.8 C) (Oral)   Resp 16   Ht 6\' 2"  (1.88 m)   Wt 95.3 kg   SpO2 100%   BMI 26.96 kg/m  Gen:   Awake, no distress   Resp:  Normal effort  MSK:   Moves extremities without difficulty  Other:  Multiple lacerations to right thumb with some discoloration, normal cap refill  Medical Decision Making  Medically screening exam initiated at 10:24 AM.  Appropriate orders placed.  Jonathan Finley was informed that the remainder of the evaluation will be completed by another provider, this initial triage assessment does not replace that evaluation, and the importance of remaining in the ED until their evaluation is complete.  Will order x-ray to r/o open fracture, will require suture repair   Jonathan Finley T, PA-C 05/08/22 1025

## 2022-05-08 NOTE — Progress Notes (Addendum)
Please note. Fentanyl pulled out of Trauma B Pyxis and wasted against this patient in error. Was intended for patient with same last name. Medication given to correct patient.  Delmar Landau, PharmD, BCPS 05/08/2022 4:18 PM ED Clinical Pharmacist -  [phone]

## 2022-10-13 IMAGING — DX DG FINGER THUMB 2+V*R*
3 series · 3 of 3 positions shown · non-contrast
Comparison: None Available.

CLINICAL DATA: Machinist injury, laceration

EXAM:
RIGHT THUMB 2+V

[finger ap]
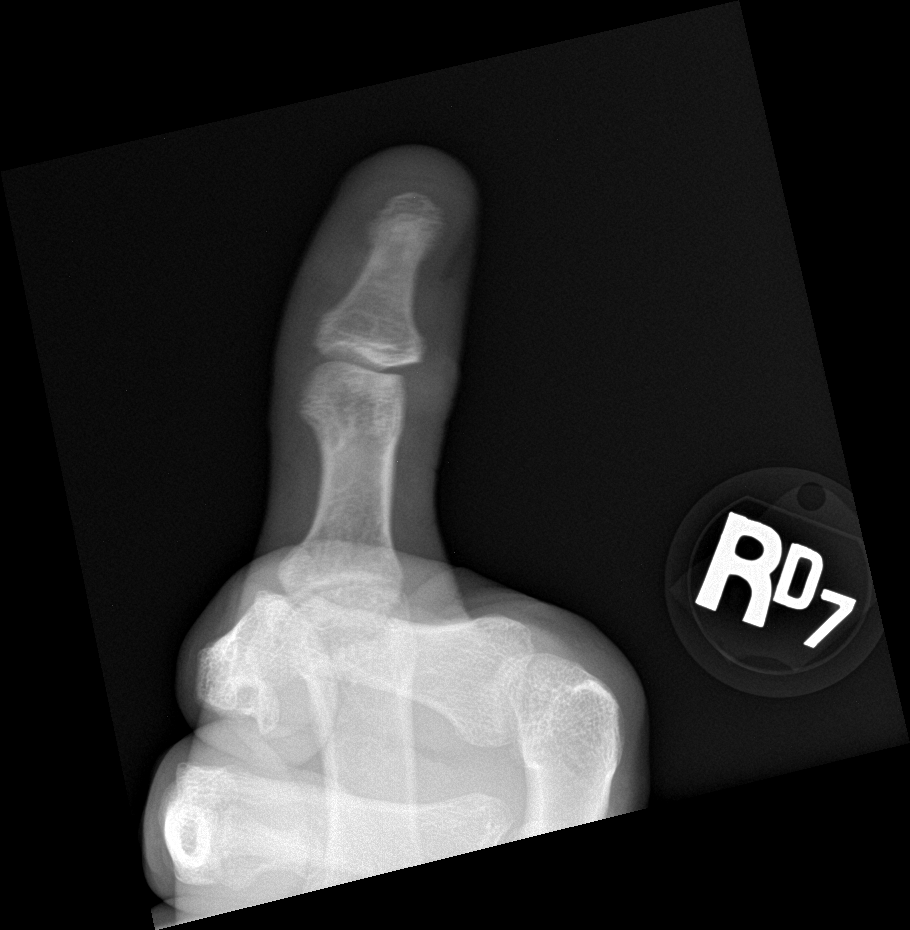

[finger obl]
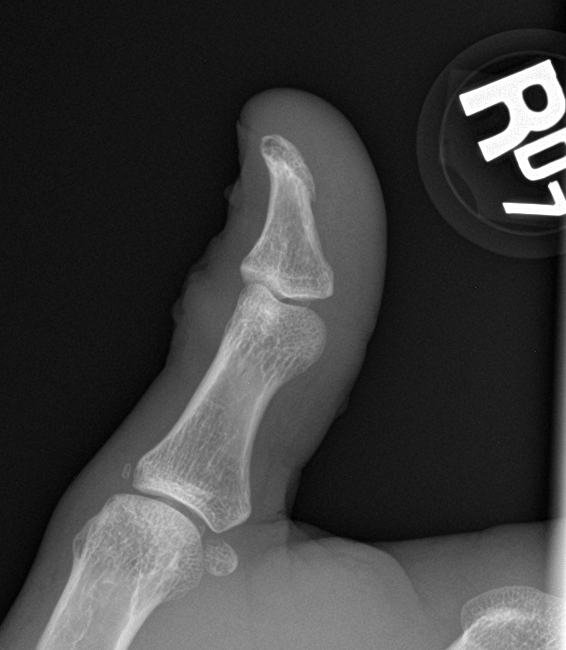

[finger lat]
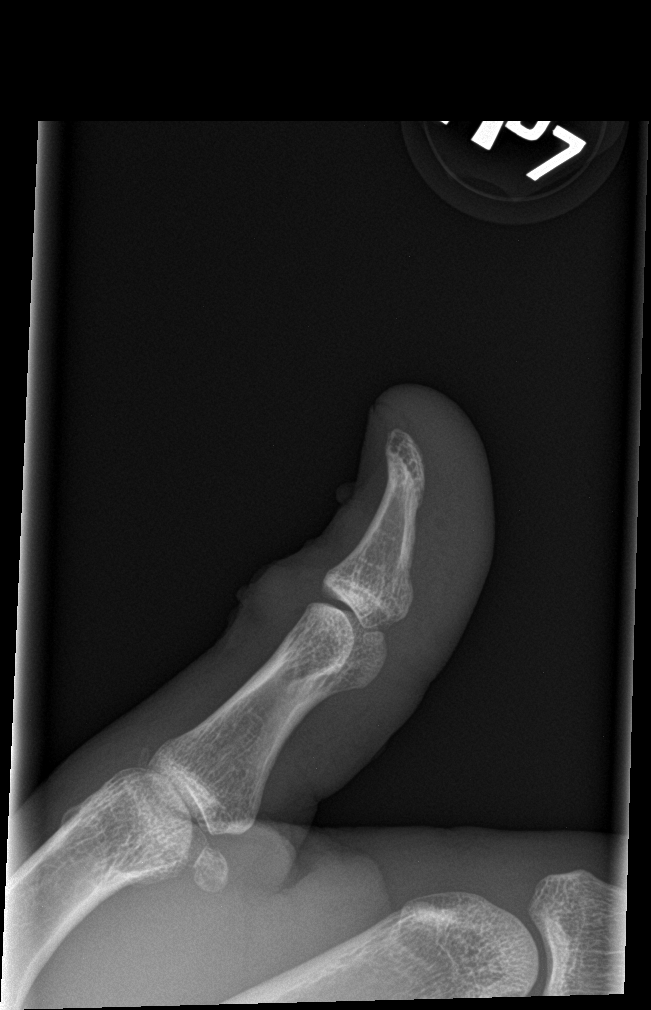

[3 of 3 positions shown; findings below may reference images not displayed]

FINDINGS: There is no evidence of fracture or dislocation. There is no
evidence of arthropathy or other focal bone abnormality. Soft tissue
edema about the digit.
IMPRESSION: No fracture or dislocation of the right thumb. Soft tissue edema
about the digit. No radiopaque foreign body.

## 2024-11-02 ENCOUNTER — Encounter (HOSPITAL_COMMUNITY): Payer: Self-pay | Admitting: General Surgery
